# Patient Record
Sex: Male | Born: 2005 | Race: Black or African American | Hispanic: No | Marital: Single | Smoking: Never smoker
Health system: Southern US, Community
[De-identification: ages and names within clinical notes are randomized; demographics above are authoritative.]

---
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
## 2024-02-02 ENCOUNTER — Emergency Department

## 2024-02-02 ENCOUNTER — Encounter: Payer: Self-pay | Admitting: Emergency Medicine

## 2024-02-02 ENCOUNTER — Emergency Department
Admission: EM | Admit: 2024-02-02 | Discharge: 2024-02-03 | Disposition: A | Discharge status: Home / Self Care | Attending: Emergency Medicine | Admitting: Emergency Medicine

## 2024-02-02 ENCOUNTER — Other Ambulatory Visit: Payer: Self-pay

## 2024-02-02 DIAGNOSIS — S82202B Unspecified fracture of shaft of left tibia, initial encounter for open fracture type I or II: Secondary | ICD-10-CM | POA: Diagnosis not present

## 2024-02-02 DIAGNOSIS — Z23 Encounter for immunization: Secondary | ICD-10-CM | POA: Diagnosis not present

## 2024-02-02 DIAGNOSIS — W3400XA Accidental discharge from unspecified firearms or gun, initial encounter: Secondary | ICD-10-CM | POA: Insufficient documentation

## 2024-02-02 DIAGNOSIS — S8992XA Unspecified injury of left lower leg, initial encounter: Secondary | ICD-10-CM | POA: Diagnosis present

## 2024-02-02 MED ORDER — TETANUS-DIPHTH-ACELL PERTUSSIS 5-2.5-18.5 LF-MCG/0.5 IM SUSY
0.5000 mL | PREFILLED_SYRINGE | Freq: Once | INTRAMUSCULAR | Status: AC
  Administered 2024-02-02: 0.5 mL via INTRAMUSCULAR
  Filled 2024-02-02: qty 0.5

## 2024-02-02 MED ORDER — CEPHALEXIN 500 MG PO CAPS: 500.0000 mg | ORAL_CAPSULE | Freq: Four times a day (QID) | ORAL | 0 refills | Status: AC

## 2024-02-02 MED ORDER — OXYCODONE-ACETAMINOPHEN 5-325 MG PO TABS
1.0000 | ORAL_TABLET | Freq: Once | ORAL | Status: AC
  Administered 2024-02-02: 1 via ORAL
  Filled 2024-02-02: qty 1

## 2024-02-02 MED ORDER — OXYCODONE-ACETAMINOPHEN 5-325 MG PO TABS: 1.0000 | ORAL_TABLET | ORAL | 0 refills | Status: AC | PRN
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
## 2024-02-02 NOTE — ED Notes (Signed)
 [employer] officer in room at this time

## 2024-02-03 NOTE — ED Notes (Signed)
 Pt given DC instructions. Pt verbalized understanding of crutch teaching and was able to return demonstration. Pt verbalized understanding of medications and follow up care. Pt taken from ED in wheelchair by this RN.
